# Patient Record
Sex: Male | Born: 2000 | Race: Black or African American | Hispanic: No | Marital: Single | Smoking: Never smoker
Health system: Southern US, Community
[De-identification: ages and names within clinical notes are randomized; demographics above are authoritative.]

## PROBLEM LIST (undated history)

## (undated) DIAGNOSIS — F419 Anxiety disorder, unspecified: Secondary | ICD-10-CM

---
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
## 2019-10-23 IMAGING — CT CT ANGIO CHEST
2 of 7 series · 19 of 46 positions shown · IV contrast (APPLIED)
Comparison: Portable chest earlier today.

CLINICAL DATA: 17-year-old male with tachycardia and abnormal
D-dimer.

EXAM:
CT ANGIOGRAPHY CHEST WITH CONTRAST
TECHNIQUE: Multidetector CT imaging of the chest was performed using the
standard protocol during bolus administration of intravenous
contrast. Multiplanar CT image reconstructions and MIPs were
obtained to evaluate the vascular anatomy.
CONTRAST:  75mL KNJQEM-OU5 IOPAMIDOL (KNJQEM-OU5) INJECTION 76%

[Series 10: thins · axial · 0.70mm/px · z∈[-303,-47]mm · 16 of 412 slices shown]
[im 23/412  lung]
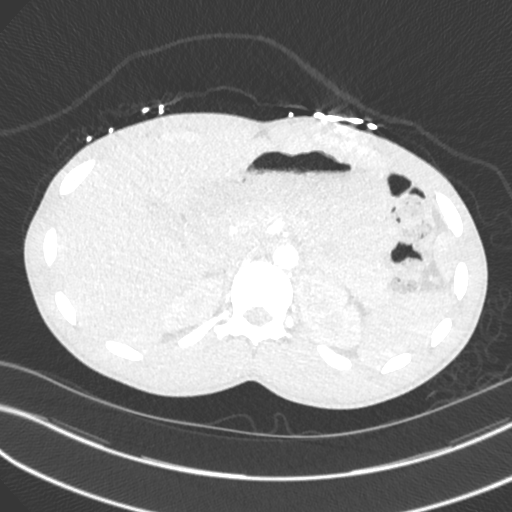
[im 46/412  soft-tissue]
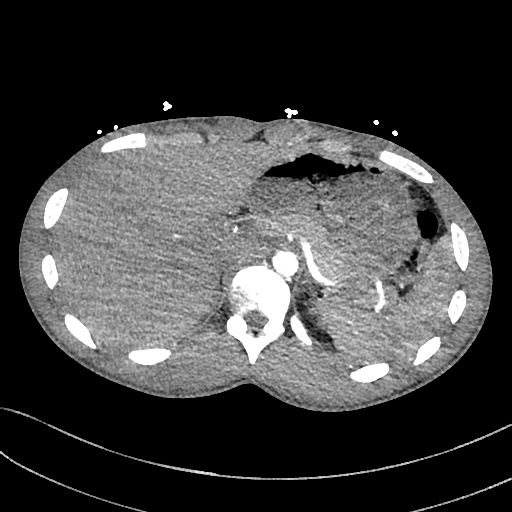
[im 69/412  lung]
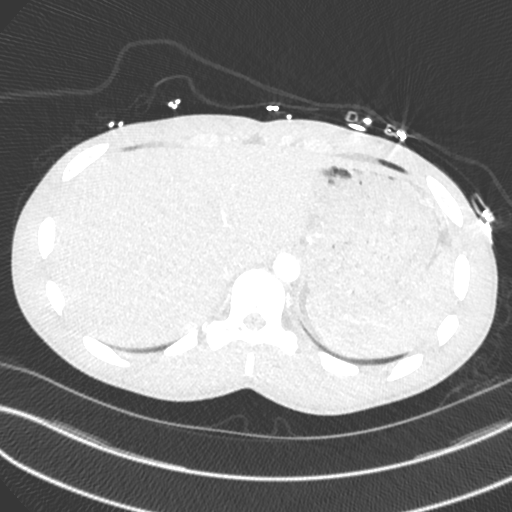
[im 92/412  soft-tissue]
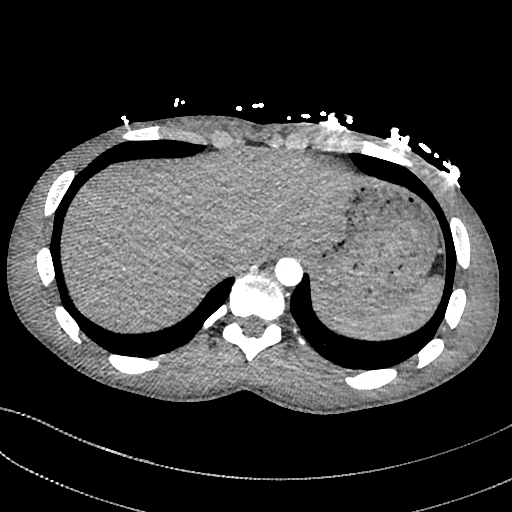
[im 115/412  lung]
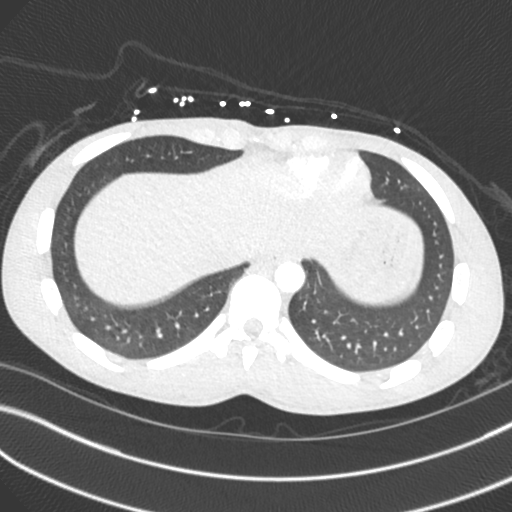
[im 138/412  soft-tissue]
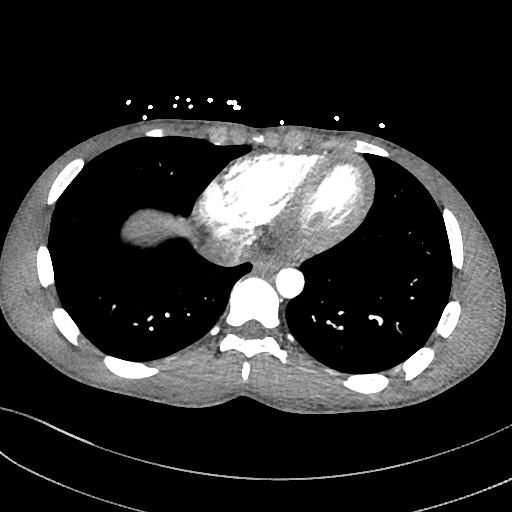
[im 160/412  lung]
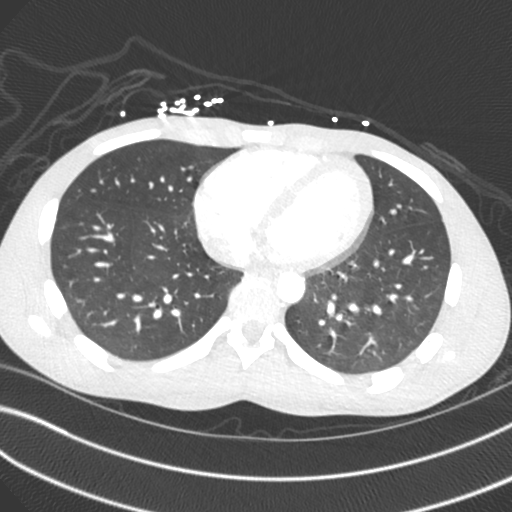
[im 183/412  soft-tissue]
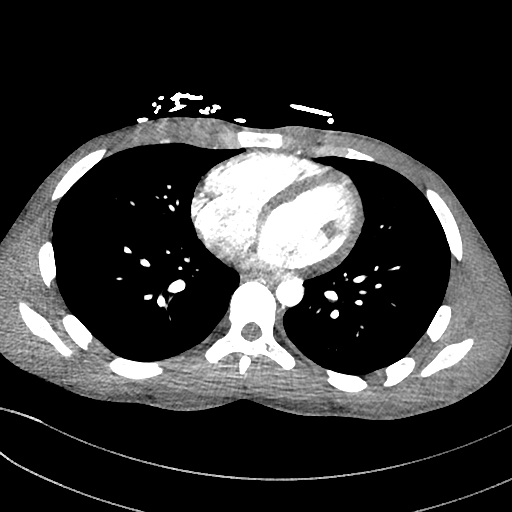
[im 229/412  lung]
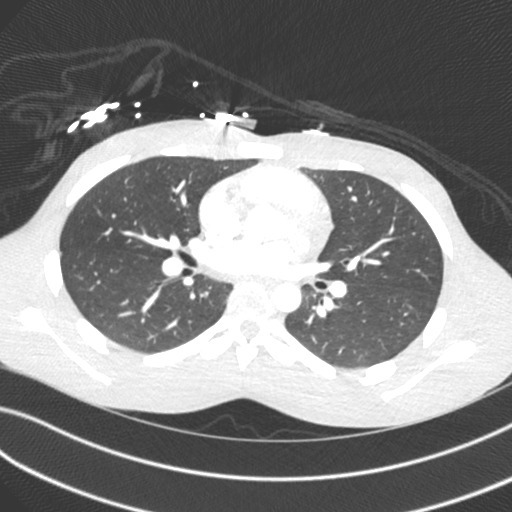
[im 252/412  soft-tissue]
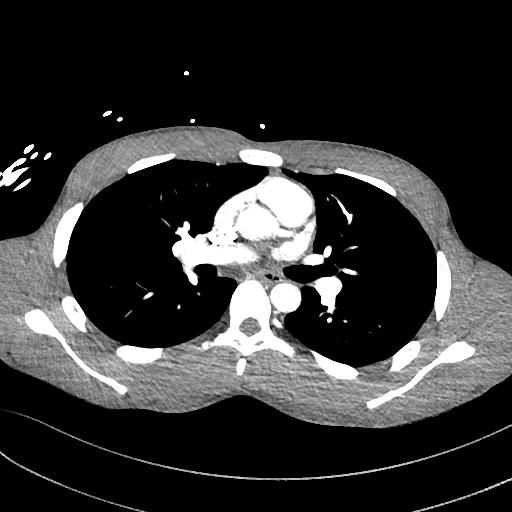
[im 275/412  lung]
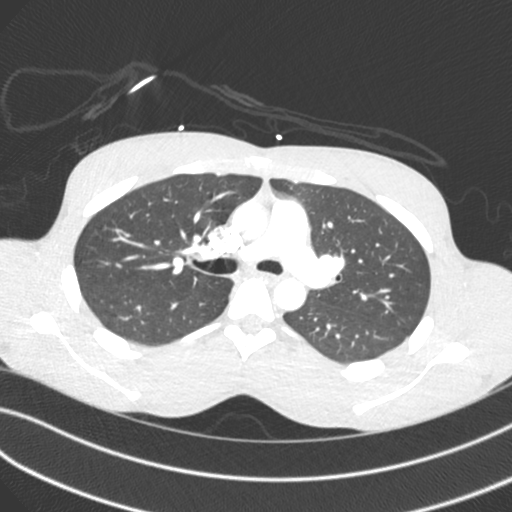
[im 297/412  soft-tissue]
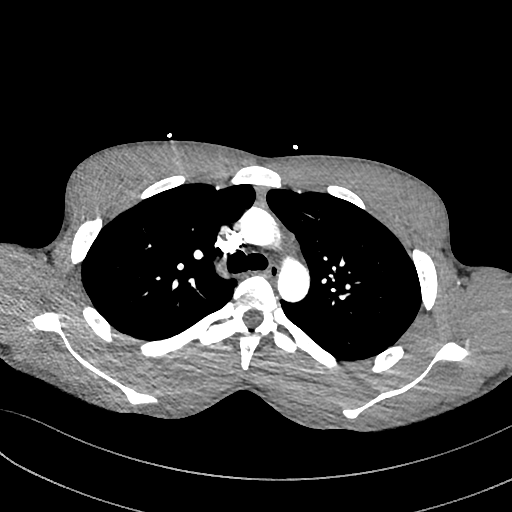
[im 320/412  lung]
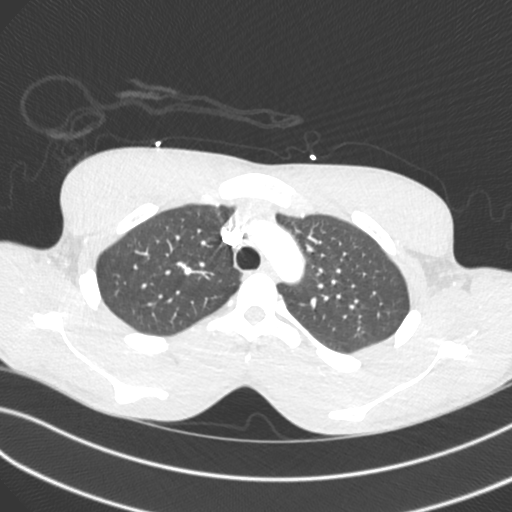
[im 343/412  soft-tissue]
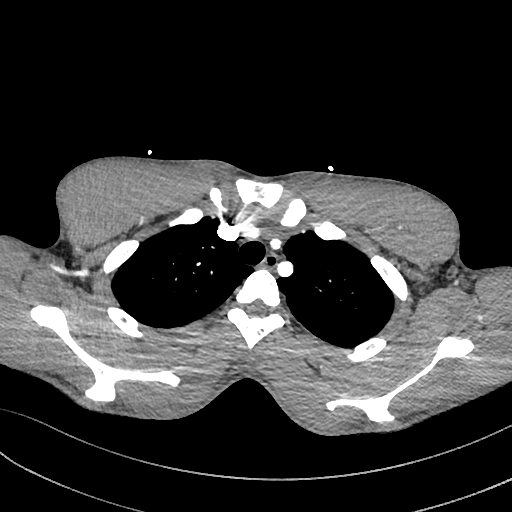
[im 366/412  lung]
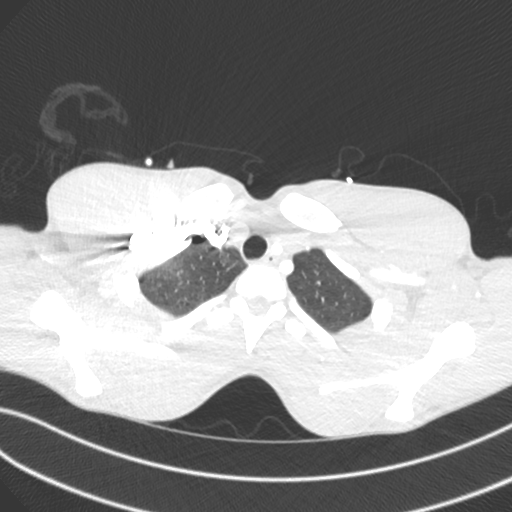
[im 389/412  soft-tissue]
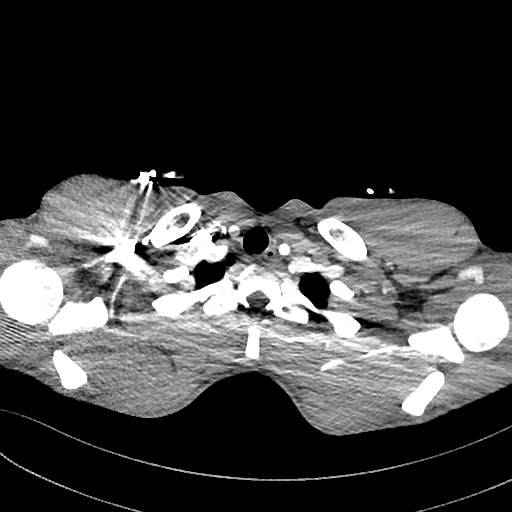

[Series 12: cor · coronal · 0.58mm/px · 3 of 102 slices shown]
[im 26/102  soft-tissue]
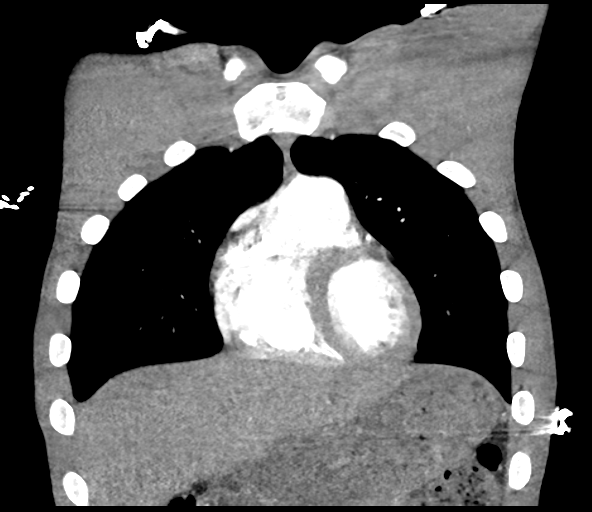
[im 51/102  soft-tissue]
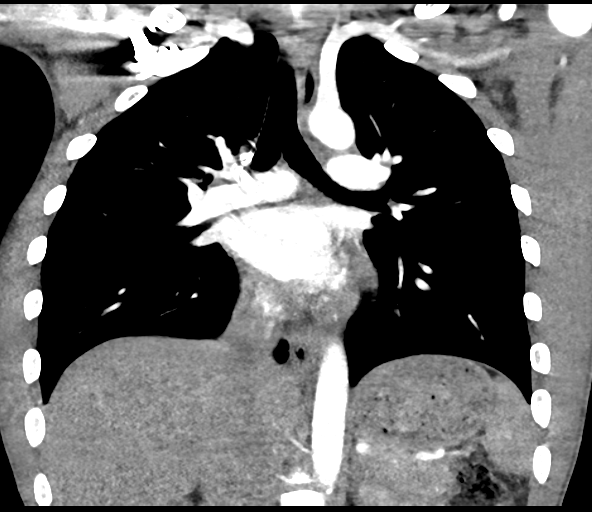
[im 76/102  soft-tissue]
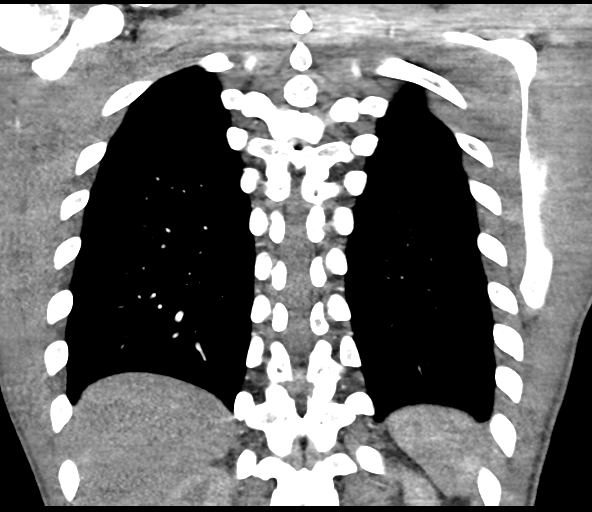

[19 of 46 positions shown; findings below may reference images not displayed]

FINDINGS: Cardiovascular: Adequate contrast bolus timing in the pulmonary
arterial tree.

No focal filling defect identified in the pulmonary arteries to
suggest acute pulmonary embolism.

Negative visible aorta and proximal great vessels. No calcified
coronary artery atherosclerosis is evident.

No cardiomegaly or pericardial effusion.

Mediastinum/Nodes: Negative.

Lungs/Pleura: Major airways are patent. Both lungs appear clear. No
pleural effusion.

Upper Abdomen: Negative visible liver, gallbladder, spleen, adrenal
glands, kidneys, pancreas, and bowel in the upper abdomen.

Musculoskeletal: Negative.

Review of the MIP images confirms the above findings.
IMPRESSION: Negative CTA chest. No evidence of acute pulmonary embolus.
